# Patient Record
Sex: Male | Born: 1985 | Hispanic: Yes | Marital: Married | Smoking: Current some day smoker
Health system: Southern US, Community
[De-identification: ages and names within clinical notes are randomized; demographics above are authoritative.]

## PROBLEM LIST (undated history)

## (undated) HISTORY — PX: APPENDECTOMY: SHX54

---

## 2010-05-23 ENCOUNTER — Inpatient Hospital Stay: Payer: Self-pay | Admitting: General Surgery

## 2010-05-25 LAB — PATHOLOGY REPORT

## 2010-09-22 ENCOUNTER — Emergency Department: Payer: Self-pay | Admitting: Emergency Medicine

## 2011-06-06 IMAGING — CR DG ABDOMEN 3V
1 series · 4 of 4 positions shown · non-contrast
Comparison: none

REASON FOR EXAM: abd pain
COMMENTS:

[Series 1: view not recorded · 0.17mm/px · 4 of 4 slices shown]
[im 1/4]
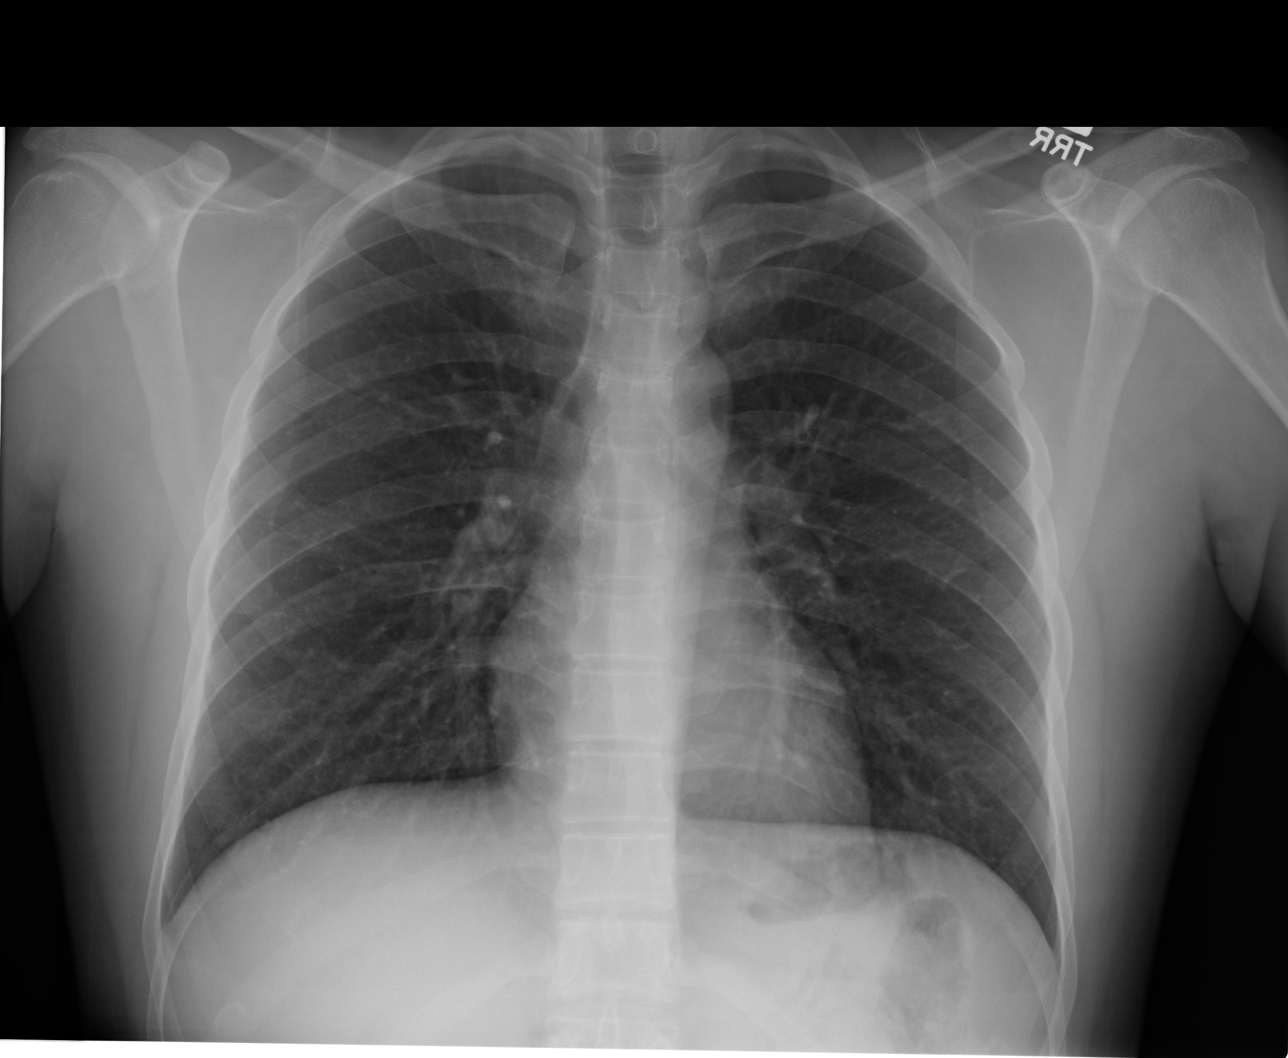
[im 2/4]
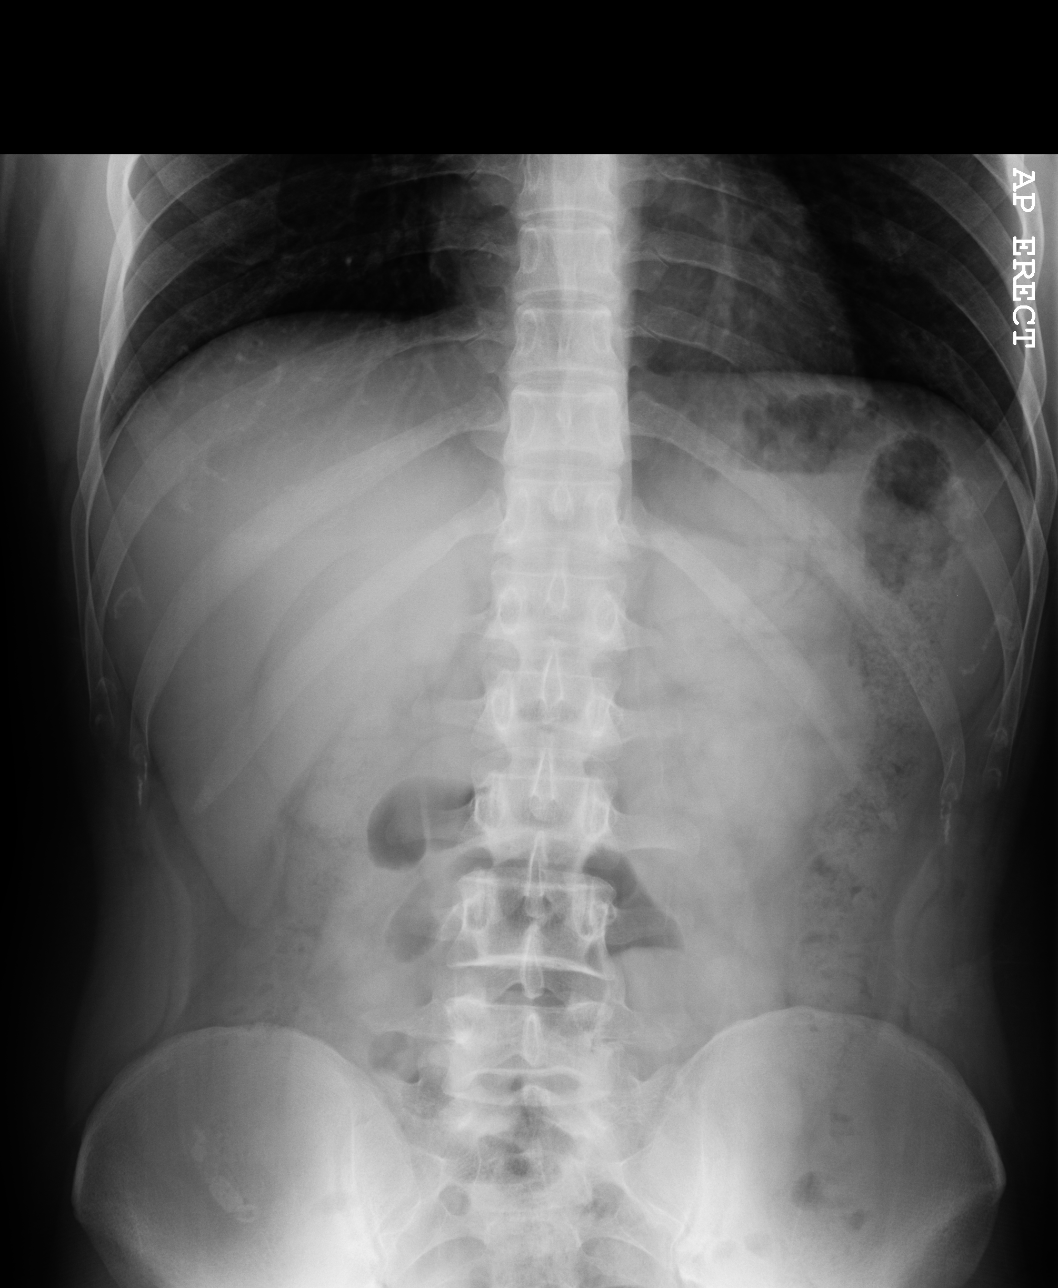
[im 3/4]
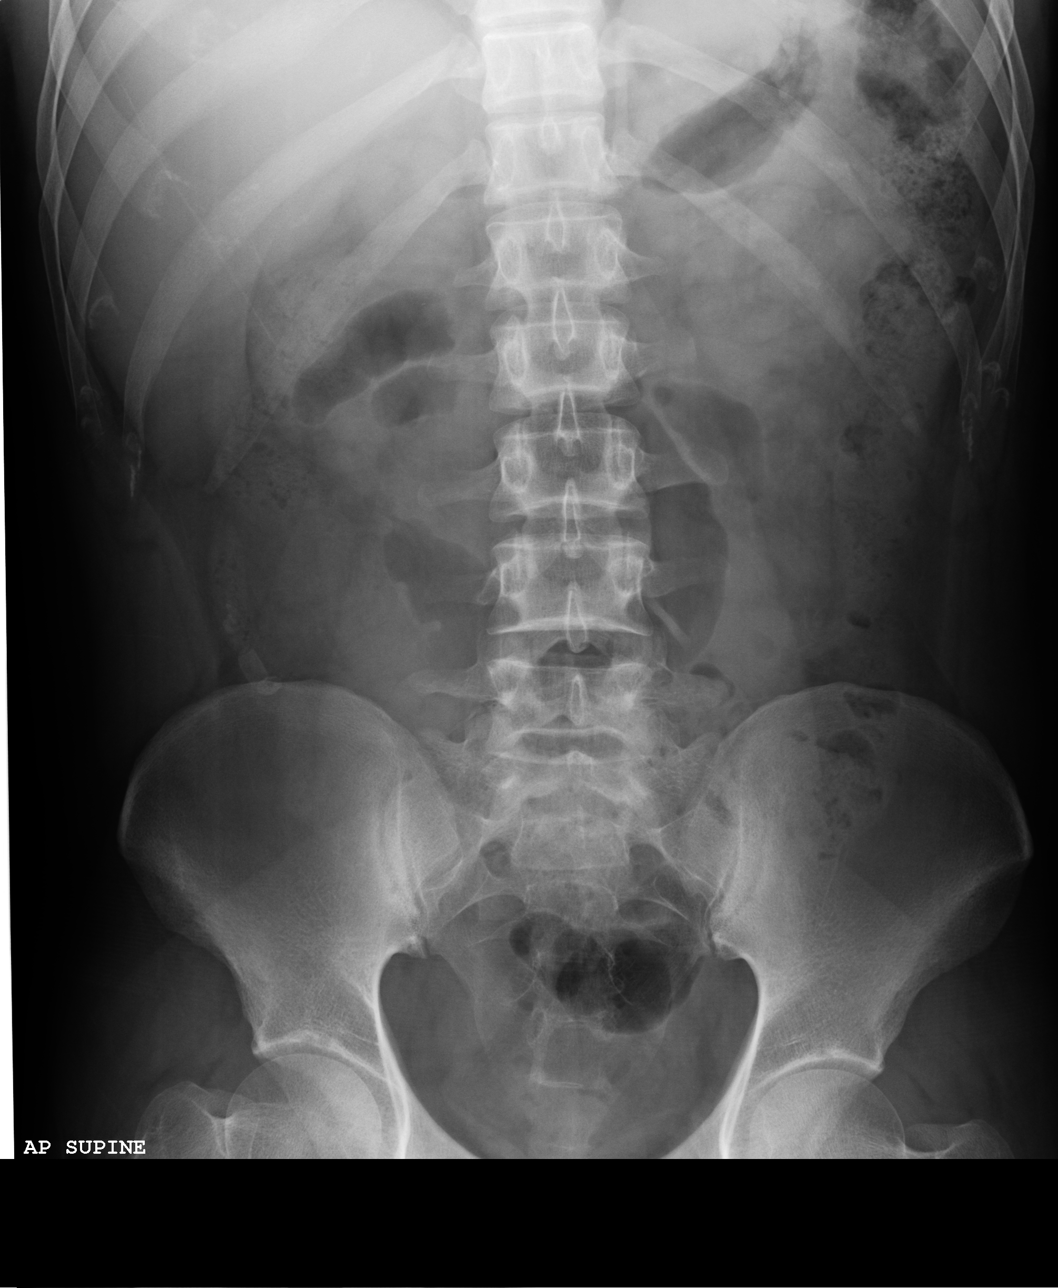
[im 4/4]
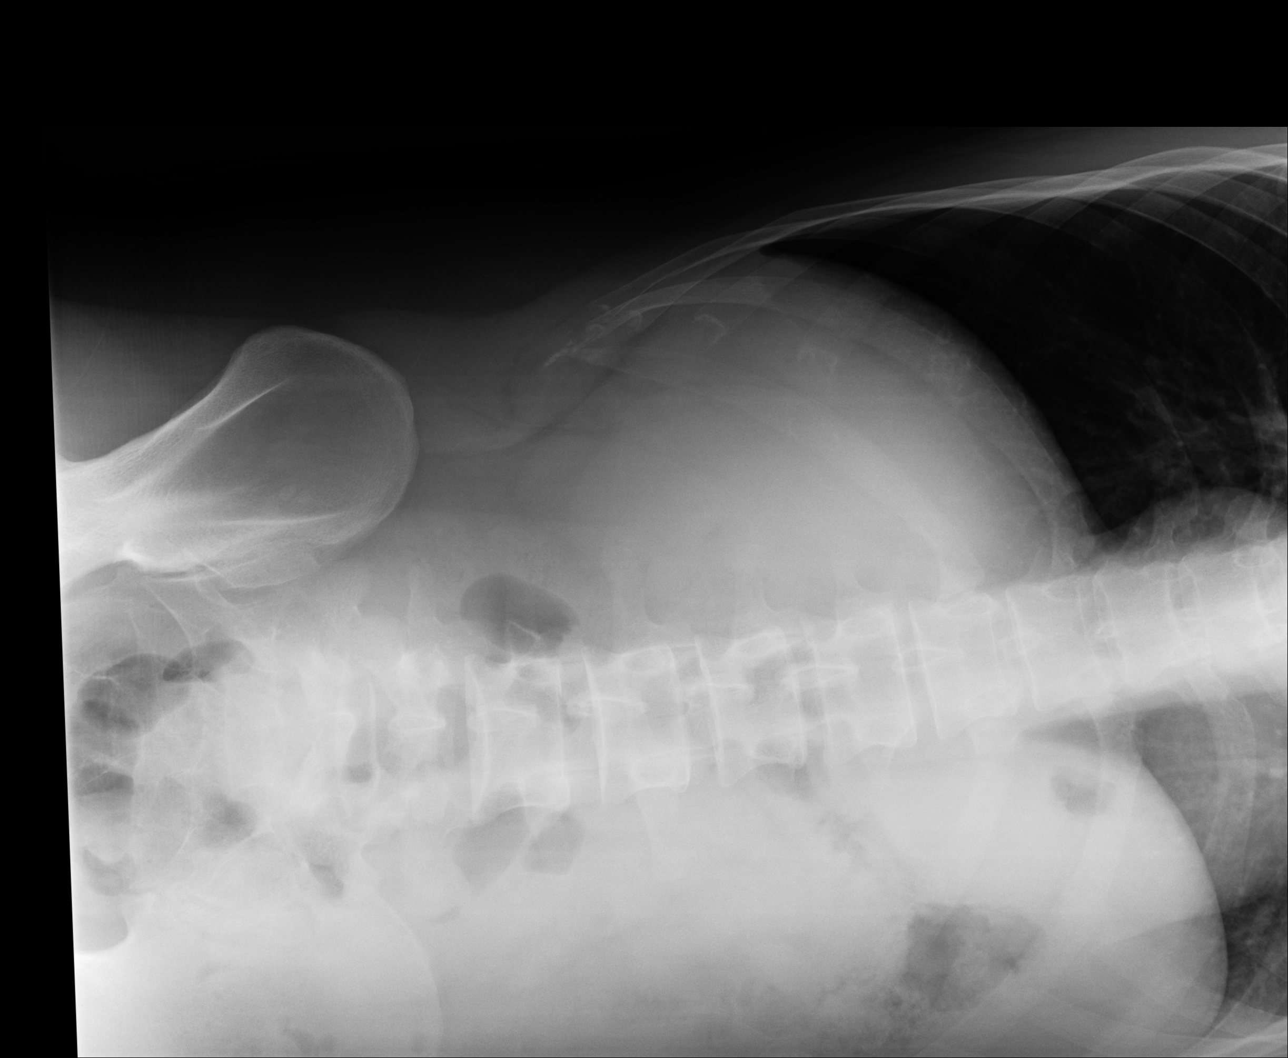

[4 of 4 positions shown; findings below may reference images not displayed]

PROCEDURE:     DXR - DXR ABDOMEN 3-WAY (INCL PA CXR)  - May 23, 2010  [DATE]

RESULT:     The lung fields are clear. The heart, mediastinal and osseous
structures show no significant abnormalities.

Three views of the abdomen were obtained. No subdiaphragmatic free air is
seen. The bowel gas pattern is normal. No bowel obstruction is seen. Gas is
visualized in both the large and small bowel. There is a moderate amount of
fecal material in the descending colon. No abnormal intra-abdominal
calcifications are seen.
IMPRESSION: No acute changes are identified.

## 2016-05-16 ENCOUNTER — Emergency Department: Payer: Self-pay

## 2016-05-16 ENCOUNTER — Encounter: Payer: Self-pay | Admitting: Emergency Medicine

## 2016-05-16 ENCOUNTER — Other Ambulatory Visit: Payer: Self-pay

## 2016-05-16 ENCOUNTER — Emergency Department
Admission: EM | Admit: 2016-05-16 | Discharge: 2016-05-16 | Disposition: A | Discharge status: Home / Self Care | Payer: Self-pay | Attending: Emergency Medicine | Admitting: Emergency Medicine

## 2016-05-16 DIAGNOSIS — F17200 Nicotine dependence, unspecified, uncomplicated: Secondary | ICD-10-CM | POA: Insufficient documentation

## 2016-05-16 DIAGNOSIS — R0602 Shortness of breath: Secondary | ICD-10-CM | POA: Insufficient documentation

## 2016-05-16 LAB — CBC
HCT: 48.7 % (ref 40.0–52.0)
Hemoglobin: 16.7 g/dL (ref 13.0–18.0)
MCH: 29.8 pg (ref 26.0–34.0)
MCHC: 34.4 g/dL (ref 32.0–36.0)
MCV: 86.9 fL (ref 80.0–100.0)
PLATELETS: 222 10*3/uL (ref 150–440)
RBC: 5.61 MIL/uL (ref 4.40–5.90)
RDW: 13.2 % (ref 11.5–14.5)
WBC: 9.5 10*3/uL (ref 3.8–10.6)

## 2016-05-16 LAB — BASIC METABOLIC PANEL
ANION GAP: 7 (ref 5–15)
BUN: 14 mg/dL (ref 6–20)
CALCIUM: 9.5 mg/dL (ref 8.9–10.3)
CO2: 26 mmol/L (ref 22–32)
Chloride: 103 mmol/L (ref 101–111)
Creatinine, Ser: 0.71 mg/dL (ref 0.61–1.24)
GLUCOSE: 146 mg/dL — AB (ref 65–99)
POTASSIUM: 3.6 mmol/L (ref 3.5–5.1)
SODIUM: 136 mmol/L (ref 135–145)

## 2016-05-16 LAB — TROPONIN I

## 2016-05-16 MED ORDER — ALBUTEROL SULFATE (2.5 MG/3ML) 0.083% IN NEBU
5.0000 mg | INHALATION_SOLUTION | Freq: Once | RESPIRATORY_TRACT | Status: AC
  Administered 2016-05-16: 5 mg via RESPIRATORY_TRACT
  Filled 2016-05-16: qty 6

## 2016-05-16 MED ORDER — ALBUTEROL SULFATE HFA 108 (90 BASE) MCG/ACT IN AERS: 2.0000 | INHALATION_SPRAY | Freq: Four times a day (QID) | RESPIRATORY_TRACT | 0 refills | Status: AC | PRN

## 2016-05-16 NOTE — ED Provider Notes (Signed)
[facility] Emergency Department Provider Note  Time seen: 5:34 PM  I have reviewed the triage vital signs and the nursing notes.   HISTORY  Chief Complaint Shortness of Breath    HPI Jesus Garner is a 30 y.o. male with no past medical history who presents the emergency department short of breath. According to the patient for the past 2-3 days he has been feeling short of breath and symptoms of a dry mouth. Denies any cough, congestion or fever. Denies any history of asthma or COPD. Patient states he feels like he cannot get a full deep breath. Denies any chest pain.  History reviewed. No pertinent past medical history.  There are no active problems to display for this patient.   Past Surgical History:  Procedure Laterality Date  . APPENDECTOMY      Prior to Admission medications   Not on File    No Known Allergies  No family history on file.  Social History Social History  Substance Use Topics  . Smoking status: Current Some Day Smoker  . Smokeless tobacco: Never Used  . Alcohol use Yes    Review of Systems Constitutional: Negative for fever Cardiovascular: Negative for chest pain. Respiratory: Positive for shortness of breath. Gastrointestinal: Negative for abdominal pain Musculoskeletal: Negative for back pain. Neurological: Negative for headache 10-point ROS otherwise negative.  ____________________________________________   PHYSICAL EXAM:  VITAL SIGNS: ED Triage Vitals  Enc Vitals Group     BP 05/16/16 1704 130/86     Pulse Rate 05/16/16 1704 82     Resp 05/16/16 1704 20     Temp 05/16/16 1704 98 F (36.7 C)     Temp Source 05/16/16 1704 Oral     SpO2 05/16/16 1704 100 %     Weight 05/16/16 1705 180 lb (81.6 kg)     Height 05/16/16 1705 5\' 8"  (1.727 m)     Head Circumference --      Peak Flow --      Pain Score --      Pain Loc --      Pain Edu? --      Excl. in GC? --     Constitutional: Alert and  oriented. Well appearing and in no distress. Eyes: Normal exam ENT   Head: Normocephalic and atraumatic.   Mouth/Throat: Mucous membranes are moist. Cardiovascular: Normal rate, regular rhythm. No murmur Respiratory: Normal respiratory effort without tachypnea nor retractions. Breath sounds are clear Gastrointestinal: Soft and nontender. No distention.   Musculoskeletal: Nontender with normal range of motion in all extremities.  Neurologic:  Normal speech and language. No gross focal neurologic deficits Skin:  Skin is warm, dry and intact.  Psychiatric: Mood and affect are normal. Speech and behavior are normal.   ____________________________________________    EKG  EKG reviewed and interpreted by myself shows normal sinus rhythm at 70 bpm, narrow QRS, normal axis, normal intervals, no ST changes. Normal EKG.  ____________________________________________    RADIOLOGY  Chest x-ray is negative  ____________________________________________   INITIAL IMPRESSION / ASSESSMENT AND PLAN / ED COURSE  Pertinent labs & imaging results that were available during my care of the patient were reviewed by me and considered in my medical decision making (see chart for details).  Patient presents the emergency department 3 days of shortness of breath and dry mouth. Patient appears well on exam. Clear lung sounds. Moist mucous membranes. We will check labs, obtain a chest x-ray we will treat with  a DuoNeb for symptomatic relief and closely monitor in the emergency department. Patient is agreeable to plan  Chest x-ray is negative. Labs are within normal limits. Troponin is negative. Patient will be discharged home with albuterol inhaler to be used as needed and PCP follow-up. Patient agreeable to plan.  ____________________________________________   FINAL CLINICAL IMPRESSION(S) / ED DIAGNOSES  Dyspnea    Minna Antis, MD 05/16/16 [phone]

## 2016-05-16 NOTE — ED Triage Notes (Signed)
Patient arrives to ED via POA for shortness of breath for the past 3 days. Patient also c/o dry mouth for the past 3 weeks. Patient denies recent illness, pain when breathing, fever. Respirations equal and unlabored at this time. Patient does not appear to be in any distress.

## 2016-06-21 ENCOUNTER — Emergency Department
Admission: EM | Admit: 2016-06-21 | Discharge: 2016-06-21 | Disposition: A | Discharge status: Home / Self Care | Payer: No Typology Code available for payment source | Attending: Emergency Medicine | Admitting: Emergency Medicine

## 2016-06-21 DIAGNOSIS — Y999 Unspecified external cause status: Secondary | ICD-10-CM | POA: Insufficient documentation

## 2016-06-21 DIAGNOSIS — S161XXA Strain of muscle, fascia and tendon at neck level, initial encounter: Secondary | ICD-10-CM | POA: Insufficient documentation

## 2016-06-21 DIAGNOSIS — F17200 Nicotine dependence, unspecified, uncomplicated: Secondary | ICD-10-CM | POA: Insufficient documentation

## 2016-06-21 DIAGNOSIS — Y92410 Unspecified street and highway as the place of occurrence of the external cause: Secondary | ICD-10-CM | POA: Diagnosis not present

## 2016-06-21 DIAGNOSIS — Y9389 Activity, other specified: Secondary | ICD-10-CM | POA: Insufficient documentation

## 2016-06-21 DIAGNOSIS — Z79899 Other long term (current) drug therapy: Secondary | ICD-10-CM | POA: Insufficient documentation

## 2016-06-21 DIAGNOSIS — S39012A Strain of muscle, fascia and tendon of lower back, initial encounter: Secondary | ICD-10-CM | POA: Diagnosis not present

## 2016-06-21 DIAGNOSIS — S199XXA Unspecified injury of neck, initial encounter: Secondary | ICD-10-CM | POA: Diagnosis present

## 2016-06-21 MED ORDER — IBUPROFEN 600 MG PO TABS
600.0000 mg | ORAL_TABLET | Freq: Once | ORAL | Status: AC
  Administered 2016-06-21: 600 mg via ORAL
  Filled 2016-06-21: qty 1

## 2016-06-21 MED ORDER — CYCLOBENZAPRINE HCL 10 MG PO TABS: 10.0000 mg | ORAL_TABLET | Freq: Three times a day (TID) | ORAL | 0 refills | Status: AC | PRN

## 2016-06-21 MED ORDER — IBUPROFEN 600 MG PO TABS: 600.0000 mg | ORAL_TABLET | Freq: Three times a day (TID) | ORAL | 0 refills | Status: AC | PRN

## 2016-06-21 MED ORDER — CYCLOBENZAPRINE HCL 10 MG PO TABS
10.0000 mg | ORAL_TABLET | Freq: Once | ORAL | Status: AC
  Administered 2016-06-21: 10 mg via ORAL
  Filled 2016-06-21: qty 1

## 2016-06-21 MED ORDER — TRAMADOL HCL 50 MG PO TABS
50.0000 mg | ORAL_TABLET | Freq: Once | ORAL | Status: AC
  Administered 2016-06-21: 50 mg via ORAL
  Filled 2016-06-21: qty 1

## 2016-06-21 MED ORDER — TRAMADOL HCL 50 MG PO TABS: 50.0000 mg | ORAL_TABLET | Freq: Four times a day (QID) | ORAL | 0 refills | Status: AC | PRN
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
## 2017-05-30 IMAGING — CR DG CHEST 2V
1 series · 2 of 2 positions shown · non-contrast
Comparison: CT Abdomen and Pelvis 05/23/2010

CLINICAL DATA: 30-year-old male with shortness of Breath for 2
weeks, increasing for 2 days and diaphoresis for 2 days. Initial
encounter.

EXAM:
CHEST  2 VIEW

[Series 1: dg chest 2 view · 0.14mm/px · 2 of 2 slices shown]
[im 1/2]
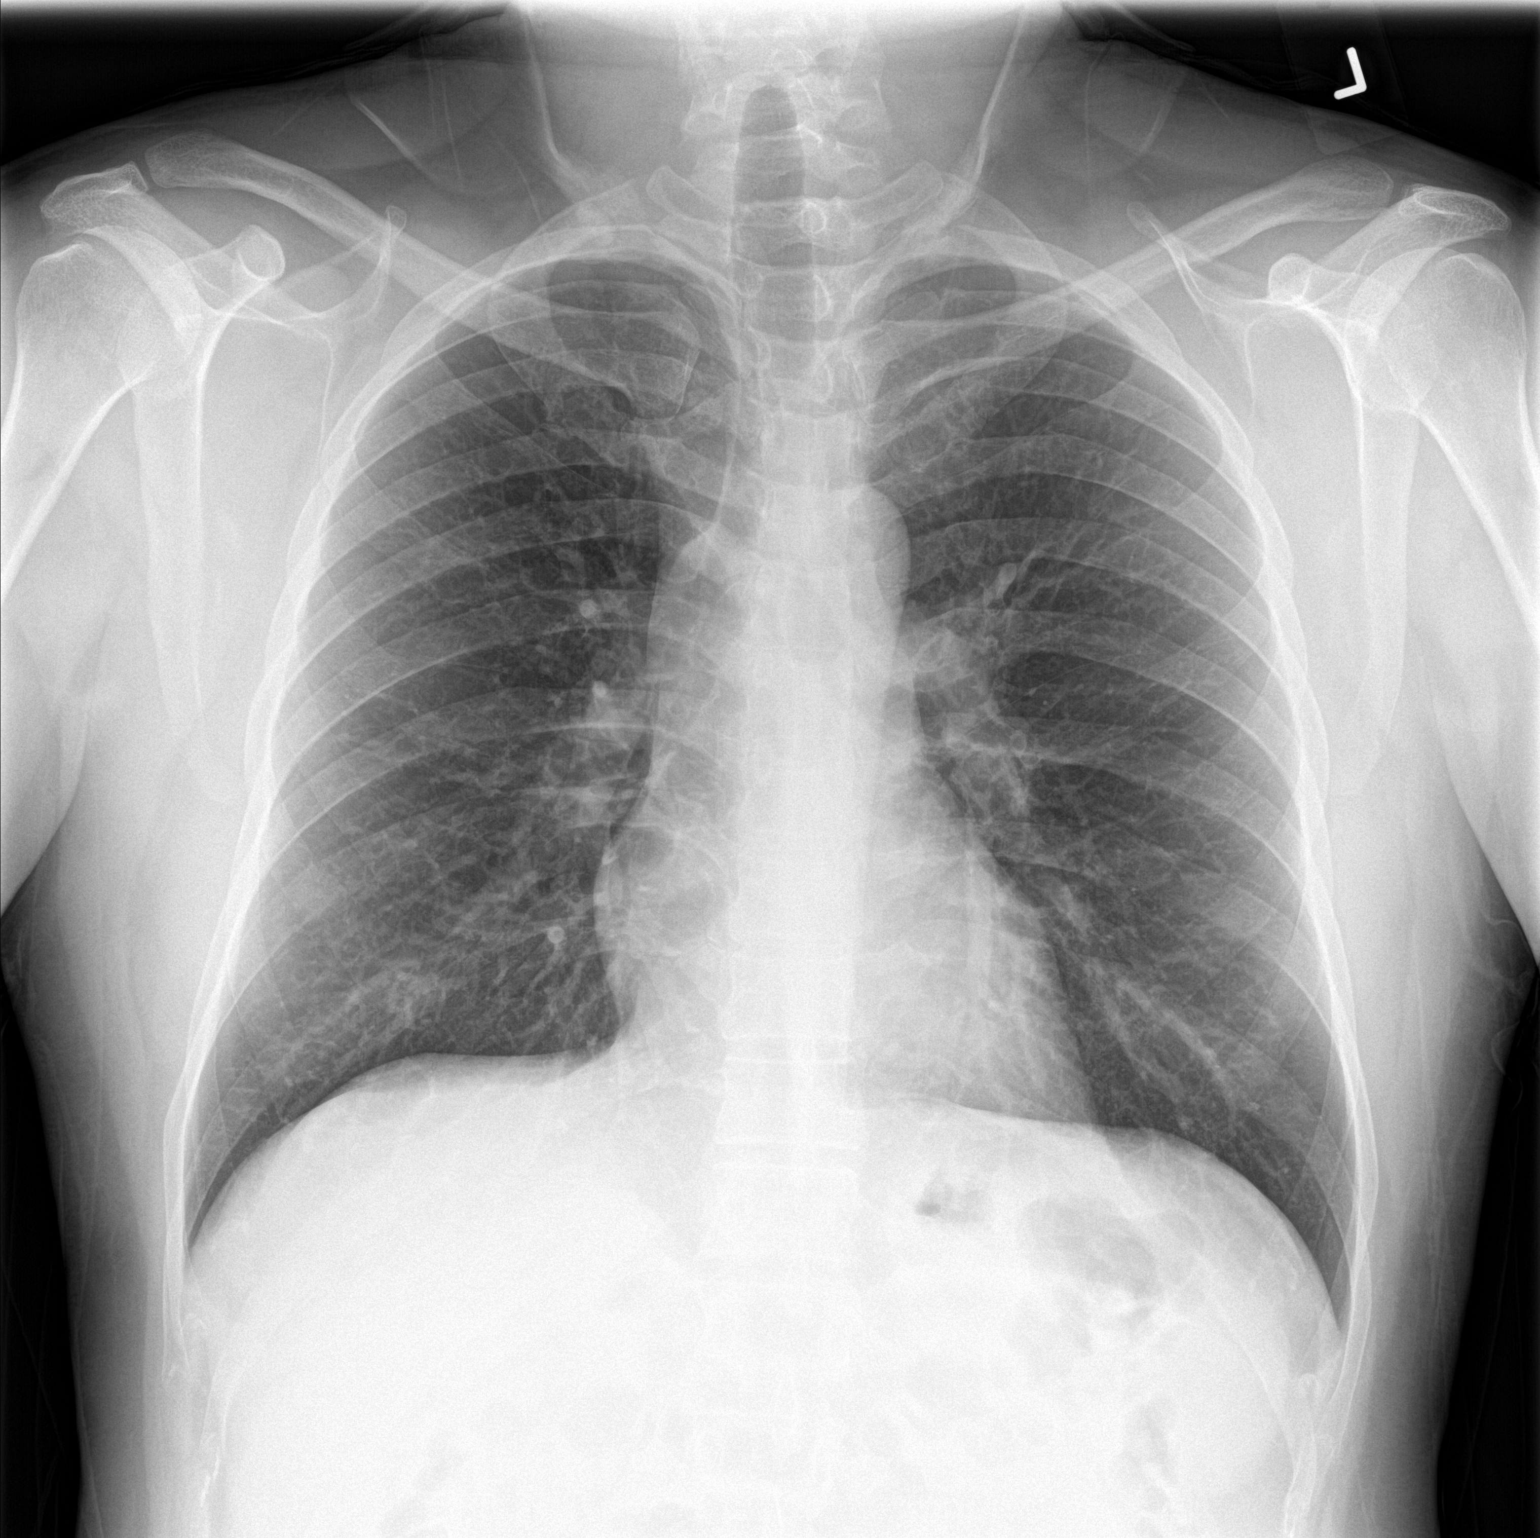
[im 2/2]
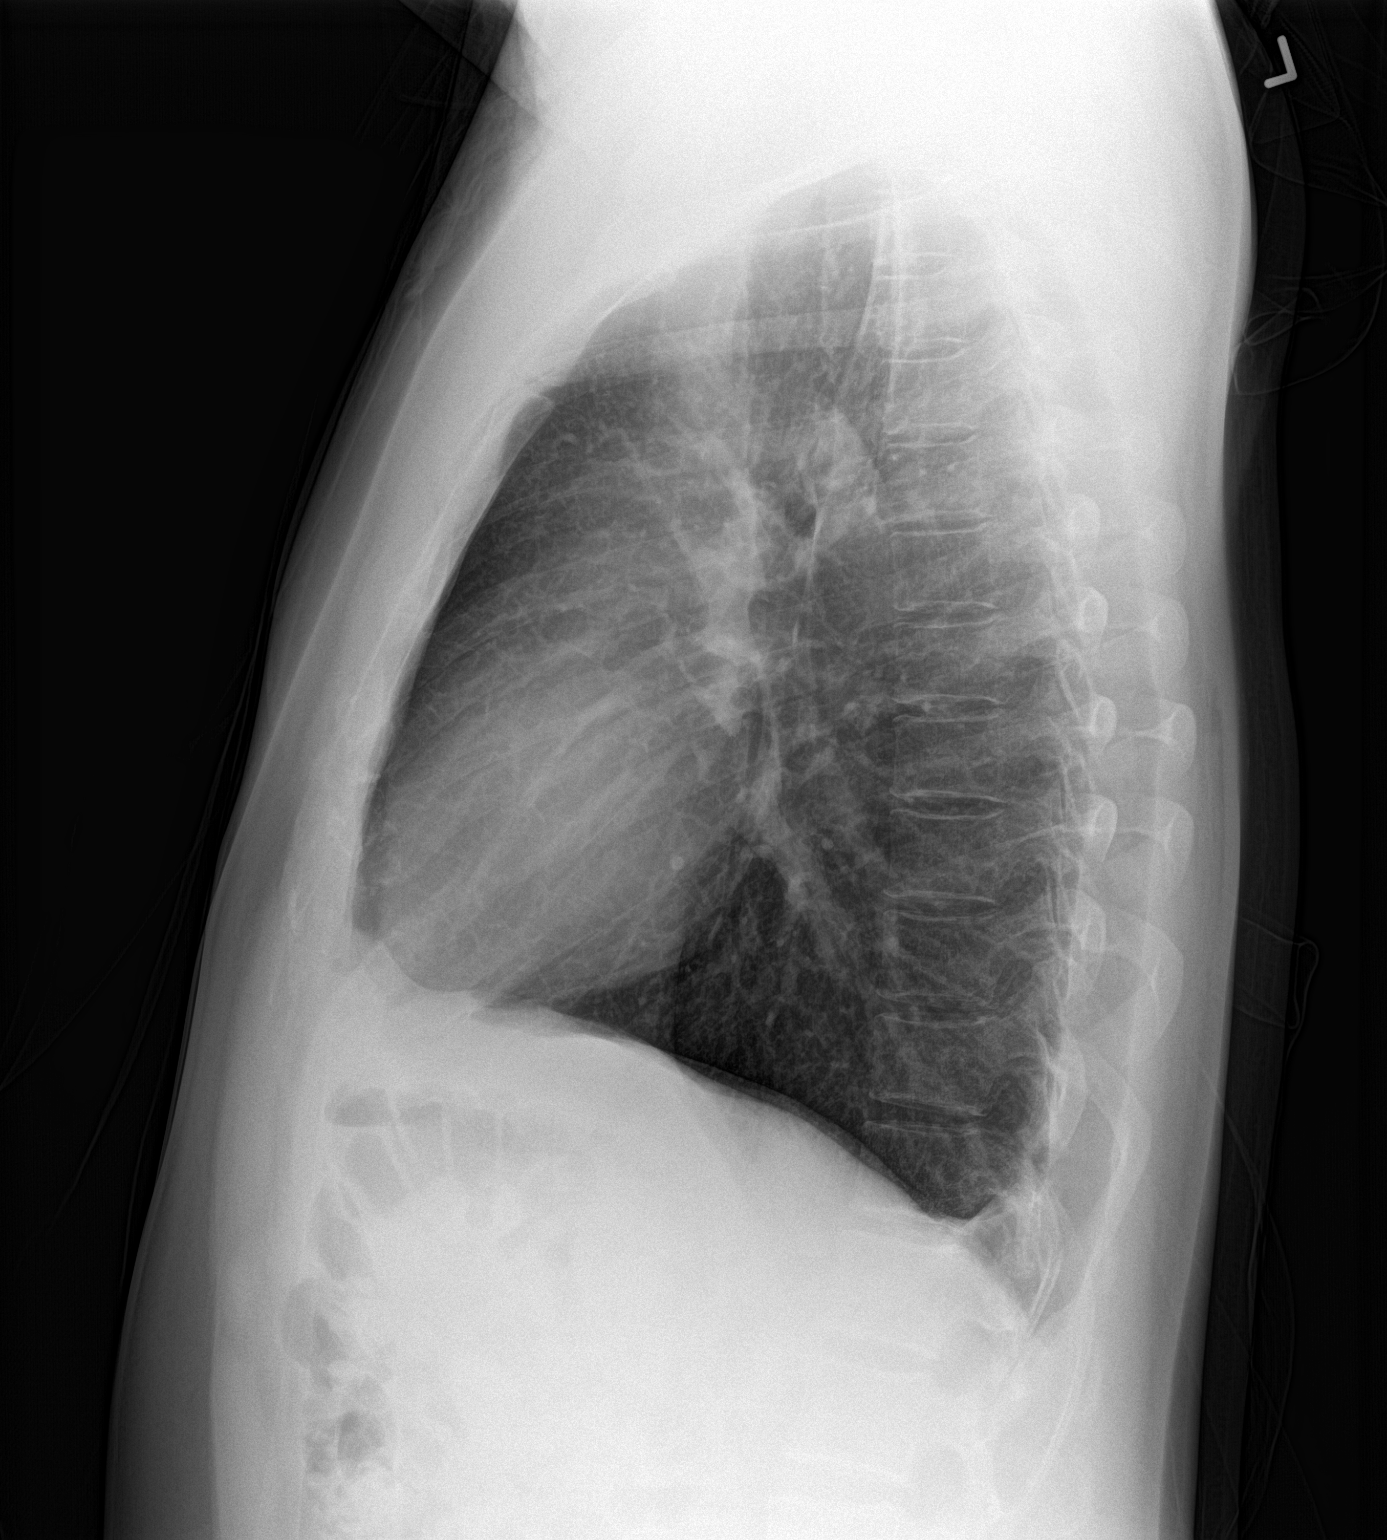

[2 of 2 positions shown; findings below may reference images not displayed]

FINDINGS: Normal cardiac size and mediastinal contours. Visualized tracheal
air column is within normal limits. No pneumothorax, pulmonary
edema, pleural effusion or confluent pulmonary opacity. No acute
osseous abnormality identified.
IMPRESSION: No acute cardiopulmonary abnormality.
